# Patient Record
Sex: Male | Born: 1940 | Race: White | Hispanic: No | Marital: Married
Health system: Southern US, Community
[De-identification: ages and names within clinical notes are randomized; demographics above are authoritative.]

---

## 2006-01-08 ENCOUNTER — Ambulatory Visit: Payer: Self-pay | Admitting: Gastroenterology

## 2006-01-24 ENCOUNTER — Other Ambulatory Visit: Payer: Self-pay

## 2006-01-26 ENCOUNTER — Inpatient Hospital Stay: Payer: Self-pay | Admitting: General Surgery

## 2006-06-11 ENCOUNTER — Ambulatory Visit: Payer: Self-pay | Admitting: Gastroenterology

## 2007-04-19 ENCOUNTER — Ambulatory Visit: Payer: Self-pay | Admitting: Family Medicine

## 2007-04-26 ENCOUNTER — Ambulatory Visit: Payer: Self-pay | Admitting: Family Medicine

## 2007-08-12 ENCOUNTER — Ambulatory Visit: Payer: Self-pay | Admitting: Cardiology

## 2007-10-03 ENCOUNTER — Ambulatory Visit: Payer: Self-pay | Admitting: Cardiology

## 2007-10-04 ENCOUNTER — Other Ambulatory Visit: Payer: Self-pay

## 2009-04-12 ENCOUNTER — Ambulatory Visit: Payer: Self-pay | Admitting: Gastroenterology

## 2010-04-05 ENCOUNTER — Ambulatory Visit: Payer: Self-pay | Admitting: Internal Medicine

## 2011-08-08 ENCOUNTER — Ambulatory Visit: Payer: Self-pay | Admitting: Gastroenterology

## 2011-08-08 LAB — PROTIME-INR
INR: 1.2
Prothrombin Time: 15.2 secs — ABNORMAL HIGH (ref 11.5–14.7)

## 2011-08-10 LAB — PATHOLOGY REPORT

## 2012-05-13 ENCOUNTER — Ambulatory Visit: Payer: Self-pay | Admitting: Unknown Physician Specialty

## 2012-10-15 ENCOUNTER — Ambulatory Visit: Payer: Self-pay

## 2013-02-05 ENCOUNTER — Ambulatory Visit: Payer: Self-pay | Admitting: Ophthalmology

## 2013-06-02 ENCOUNTER — Ambulatory Visit: Payer: Self-pay | Admitting: Urology

## 2013-07-03 ENCOUNTER — Ambulatory Visit: Payer: Self-pay | Admitting: Urology

## 2013-07-03 LAB — PROTIME-INR
INR: 1.8
Prothrombin Time: 20.5 secs — ABNORMAL HIGH (ref 11.5–14.7)

## 2013-07-17 ENCOUNTER — Ambulatory Visit: Payer: Self-pay | Admitting: Urology

## 2013-07-17 LAB — PROTIME-INR
INR: 1.2
PROTHROMBIN TIME: 15.1 s — AB (ref 11.5–14.7)

## 2014-01-09 ENCOUNTER — Emergency Department: Payer: Self-pay | Admitting: Emergency Medicine

## 2014-01-09 LAB — COMPREHENSIVE METABOLIC PANEL
AST: 41 U/L — AB (ref 15–37)
Albumin: 3.3 g/dL — ABNORMAL LOW (ref 3.4–5.0)
Alkaline Phosphatase: 79 U/L
Anion Gap: 9 (ref 7–16)
BUN: 33 mg/dL — AB (ref 7–18)
Bilirubin,Total: 2.2 mg/dL — ABNORMAL HIGH (ref 0.2–1.0)
CREATININE: 1.69 mg/dL — AB (ref 0.60–1.30)
Calcium, Total: 8.9 mg/dL (ref 8.5–10.1)
Chloride: 103 mmol/L (ref 98–107)
Co2: 21 mmol/L (ref 21–32)
EGFR (African American): 45 — ABNORMAL LOW
EGFR (Non-African Amer.): 39 — ABNORMAL LOW
GLUCOSE: 128 mg/dL — AB (ref 65–99)
OSMOLALITY: 275 (ref 275–301)
Potassium: 4.6 mmol/L (ref 3.5–5.1)
SGPT (ALT): 28 U/L
Sodium: 133 mmol/L — ABNORMAL LOW (ref 136–145)
TOTAL PROTEIN: 7.2 g/dL (ref 6.4–8.2)

## 2014-01-09 LAB — TROPONIN I: TROPONIN-I: 0.03 ng/mL

## 2014-01-09 LAB — URINALYSIS, COMPLETE
Bilirubin,UR: NEGATIVE
Glucose,UR: 50 mg/dL (ref 0–75)
Ketone: NEGATIVE
Nitrite: NEGATIVE
Ph: 6 (ref 4.5–8.0)
Protein: 100
RBC,UR: 3462 /HPF (ref 0–5)
Specific Gravity: 1.016 (ref 1.003–1.030)
Squamous Epithelial: 4

## 2014-01-09 LAB — CBC
HCT: 39.4 % — ABNORMAL LOW (ref 40.0–52.0)
HGB: 12.7 g/dL — AB (ref 13.0–18.0)
MCH: 28.9 pg (ref 26.0–34.0)
MCHC: 32.2 g/dL (ref 32.0–36.0)
MCV: 90 fL (ref 80–100)
Platelet: 125 10*3/uL — ABNORMAL LOW (ref 150–440)
RBC: 4.39 10*6/uL — ABNORMAL LOW (ref 4.40–5.90)
RDW: 16.4 % — ABNORMAL HIGH (ref 11.5–14.5)
WBC: 7.4 10*3/uL (ref 3.8–10.6)

## 2014-02-23 ENCOUNTER — Ambulatory Visit: Payer: Self-pay | Admitting: Cardiology

## 2014-05-18 ENCOUNTER — Inpatient Hospital Stay: Payer: Self-pay | Admitting: Internal Medicine

## 2014-05-18 LAB — COMPREHENSIVE METABOLIC PANEL
ALBUMIN: 3.3 g/dL — AB (ref 3.4–5.0)
AST: 35 U/L (ref 15–37)
Alkaline Phosphatase: 107 U/L
Anion Gap: 11 (ref 7–16)
BILIRUBIN TOTAL: 1.7 mg/dL — AB (ref 0.2–1.0)
BUN: 23 mg/dL — ABNORMAL HIGH (ref 7–18)
CREATININE: 1.33 mg/dL — AB (ref 0.60–1.30)
Calcium, Total: 8.7 mg/dL (ref 8.5–10.1)
Chloride: 94 mmol/L — ABNORMAL LOW (ref 98–107)
Co2: 24 mmol/L (ref 21–32)
GFR CALC NON AF AMER: 56 — AB
GLUCOSE: 126 mg/dL — AB (ref 65–99)
Osmolality: 264 (ref 275–301)
Potassium: 4.3 mmol/L (ref 3.5–5.1)
SGPT (ALT): 30 U/L
Sodium: 129 mmol/L — ABNORMAL LOW (ref 136–145)
Total Protein: 7.1 g/dL (ref 6.4–8.2)

## 2014-05-18 LAB — CBC
HCT: 30.3 % — ABNORMAL LOW (ref 40.0–52.0)
HGB: 9.8 g/dL — AB (ref 13.0–18.0)
MCH: 29.2 pg (ref 26.0–34.0)
MCHC: 32.5 g/dL (ref 32.0–36.0)
MCV: 90 fL (ref 80–100)
PLATELETS: 179 10*3/uL (ref 150–440)
RBC: 3.37 10*6/uL — ABNORMAL LOW (ref 4.40–5.90)
RDW: 16.8 % — AB (ref 11.5–14.5)
WBC: 8.2 10*3/uL (ref 3.8–10.6)

## 2014-05-18 LAB — URINALYSIS, COMPLETE
BACTERIA: NONE SEEN
Bilirubin,UR: NEGATIVE
GLUCOSE, UR: NEGATIVE mg/dL (ref 0–75)
Ketone: NEGATIVE
Leukocyte Esterase: NEGATIVE
Nitrite: NEGATIVE
PROTEIN: NEGATIVE
Ph: 5 (ref 4.5–8.0)
RBC,UR: 18 /HPF (ref 0–5)
Specific Gravity: 1.014 (ref 1.003–1.030)
WBC UR: 1 /HPF (ref 0–5)

## 2014-05-18 LAB — PROTIME-INR
INR: 2.8
PROTHROMBIN TIME: 28.7 s — AB (ref 11.5–14.7)

## 2014-05-18 LAB — PRO B NATRIURETIC PEPTIDE: B-Type Natriuretic Peptide: 12033 pg/mL — ABNORMAL HIGH (ref 0–450)

## 2014-05-18 LAB — TROPONIN I: Troponin-I: 0.04 ng/mL

## 2014-05-19 LAB — PROTIME-INR
INR: 2.8
Prothrombin Time: 28.7 secs — ABNORMAL HIGH (ref 11.5–14.7)

## 2014-05-19 LAB — BASIC METABOLIC PANEL
Anion Gap: 7 (ref 7–16)
BUN: 24 mg/dL — ABNORMAL HIGH (ref 7–18)
CALCIUM: 8.2 mg/dL — AB (ref 8.5–10.1)
CO2: 28 mmol/L (ref 21–32)
Chloride: 96 mmol/L — ABNORMAL LOW (ref 98–107)
Creatinine: 1.22 mg/dL (ref 0.60–1.30)
EGFR (African American): >60
EGFR (Non-African Amer.): >60
GLUCOSE: 95 mg/dL (ref 65–99)
OSMOLALITY: 267 (ref 275–301)
POTASSIUM: 4 mmol/L (ref 3.5–5.1)
Sodium: 131 mmol/L — ABNORMAL LOW (ref 136–145)

## 2014-05-19 LAB — CBC WITH DIFFERENTIAL/PLATELET
BASOS ABS: 0 10*3/uL (ref 0.0–0.1)
Basophil %: 0.4 %
EOS PCT: 1.3 %
Eosinophil #: 0.1 10*3/uL (ref 0.0–0.7)
HCT: 25.8 % — ABNORMAL LOW (ref 40.0–52.0)
HGB: 8.4 g/dL — ABNORMAL LOW (ref 13.0–18.0)
LYMPHS PCT: 11.7 %
Lymphocyte #: 0.7 10*3/uL — ABNORMAL LOW (ref 1.0–3.6)
MCH: 29 pg (ref 26.0–34.0)
MCHC: 32.6 g/dL (ref 32.0–36.0)
MCV: 89 fL (ref 80–100)
MONOS PCT: 9.3 %
Monocyte #: 0.5 x10 3/mm (ref 0.2–1.0)
NEUTROS PCT: 77.3 %
Neutrophil #: 4.4 10*3/uL (ref 1.4–6.5)
Platelet: 144 10*3/uL — ABNORMAL LOW (ref 150–440)
RBC: 2.91 10*6/uL — AB (ref 4.40–5.90)
RDW: 16.4 % — ABNORMAL HIGH (ref 11.5–14.5)
WBC: 5.6 10*3/uL (ref 3.8–10.6)

## 2014-05-19 LAB — PRO B NATRIURETIC PEPTIDE: B-TYPE NATIURETIC PEPTID: 16191 pg/mL — AB (ref 0–450)

## 2014-05-20 LAB — CBC WITH DIFFERENTIAL/PLATELET
Basophil #: 0 10*3/uL (ref 0.0–0.1)
Basophil %: 0.4 %
EOS PCT: 5.2 %
Eosinophil #: 0.3 10*3/uL (ref 0.0–0.7)
HCT: 25.2 % — ABNORMAL LOW (ref 40.0–52.0)
HGB: 8.3 g/dL — ABNORMAL LOW (ref 13.0–18.0)
LYMPHS PCT: 14.5 %
Lymphocyte #: 0.7 10*3/uL — ABNORMAL LOW (ref 1.0–3.6)
MCH: 29.2 pg (ref 26.0–34.0)
MCHC: 32.8 g/dL (ref 32.0–36.0)
MCV: 89 fL (ref 80–100)
MONO ABS: 0.6 x10 3/mm (ref 0.2–1.0)
Monocyte %: 11.4 %
NEUTROS PCT: 68.5 %
Neutrophil #: 3.4 10*3/uL (ref 1.4–6.5)
PLATELETS: 145 10*3/uL — AB (ref 150–440)
RBC: 2.83 10*6/uL — ABNORMAL LOW (ref 4.40–5.90)
RDW: 16.4 % — ABNORMAL HIGH (ref 11.5–14.5)
WBC: 4.9 10*3/uL (ref 3.8–10.6)

## 2014-05-20 LAB — BASIC METABOLIC PANEL
ANION GAP: 6 — AB (ref 7–16)
BUN: 32 mg/dL — ABNORMAL HIGH (ref 7–18)
CHLORIDE: 95 mmol/L — AB (ref 98–107)
CO2: 29 mmol/L (ref 21–32)
CREATININE: 1.45 mg/dL — AB (ref 0.60–1.30)
Calcium, Total: 8 mg/dL — ABNORMAL LOW (ref 8.5–10.1)
EGFR (African American): >60
GFR CALC NON AF AMER: 50 — AB
Glucose: 89 mg/dL (ref 65–99)
OSMOLALITY: 267 (ref 275–301)
POTASSIUM: 3.8 mmol/L (ref 3.5–5.1)
Sodium: 130 mmol/L — ABNORMAL LOW (ref 136–145)

## 2014-05-20 LAB — PROTIME-INR
INR: 2.5
PROTHROMBIN TIME: 26.7 s — AB (ref 11.5–14.7)

## 2014-05-21 LAB — CBC WITH DIFFERENTIAL/PLATELET
BASOS ABS: 0 10*3/uL (ref 0.0–0.1)
BASOS PCT: 0.8 %
EOS PCT: 3.8 %
Eosinophil #: 0.2 10*3/uL (ref 0.0–0.7)
HCT: 28.3 % — AB (ref 40.0–52.0)
HGB: 9.2 g/dL — ABNORMAL LOW (ref 13.0–18.0)
Lymphocyte #: 0.8 10*3/uL — ABNORMAL LOW (ref 1.0–3.6)
Lymphocyte %: 13.3 %
MCH: 28.9 pg (ref 26.0–34.0)
MCHC: 32.6 g/dL (ref 32.0–36.0)
MCV: 89 fL (ref 80–100)
MONOS PCT: 9.9 %
Monocyte #: 0.6 x10 3/mm (ref 0.2–1.0)
Neutrophil #: 4.1 10*3/uL (ref 1.4–6.5)
Neutrophil %: 72.2 %
Platelet: 199 10*3/uL (ref 150–440)
RBC: 3.19 10*6/uL — ABNORMAL LOW (ref 4.40–5.90)
RDW: 16 % — ABNORMAL HIGH (ref 11.5–14.5)
WBC: 5.7 10*3/uL (ref 3.8–10.6)

## 2014-05-21 LAB — MAGNESIUM: MAGNESIUM: 2 mg/dL

## 2014-05-21 LAB — CREATININE, SERUM
Creatinine: 1.48 mg/dL — ABNORMAL HIGH (ref 0.60–1.30)
EGFR (African American): 60 — ABNORMAL LOW
EGFR (Non-African Amer.): 49 — ABNORMAL LOW

## 2014-05-21 LAB — PROTIME-INR
INR: 2.1
Prothrombin Time: 23.3 secs — ABNORMAL HIGH (ref 11.5–14.7)

## 2014-05-21 LAB — POTASSIUM: Potassium: 4.1 mmol/L (ref 3.5–5.1)

## 2014-05-22 LAB — BASIC METABOLIC PANEL
Anion Gap: 7 (ref 7–16)
BUN: 33 mg/dL — ABNORMAL HIGH (ref 7–18)
CALCIUM: 8.8 mg/dL (ref 8.5–10.1)
CHLORIDE: 96 mmol/L — AB (ref 98–107)
Co2: 29 mmol/L (ref 21–32)
Creatinine: 1.52 mg/dL — ABNORMAL HIGH (ref 0.60–1.30)
EGFR (Non-African Amer.): 48 — ABNORMAL LOW
GFR CALC AF AMER: 58 — AB
Glucose: 101 mg/dL — ABNORMAL HIGH (ref 65–99)
Osmolality: 272 (ref 275–301)
Potassium: 4.2 mmol/L (ref 3.5–5.1)
Sodium: 132 mmol/L — ABNORMAL LOW (ref 136–145)

## 2014-05-22 LAB — PROTIME-INR
INR: 2.2
Prothrombin Time: 23.6 secs — ABNORMAL HIGH (ref 11.5–14.7)

## 2014-05-23 LAB — BASIC METABOLIC PANEL
Anion Gap: 8 (ref 7–16)
BUN: 28 mg/dL — ABNORMAL HIGH (ref 7–18)
CALCIUM: 8.7 mg/dL (ref 8.5–10.1)
CREATININE: 1.54 mg/dL — AB (ref 0.60–1.30)
Chloride: 99 mmol/L (ref 98–107)
Co2: 29 mmol/L (ref 21–32)
GFR CALC AF AMER: 57 — AB
GFR CALC NON AF AMER: 47 — AB
Glucose: 97 mg/dL (ref 65–99)
OSMOLALITY: 277 (ref 275–301)
POTASSIUM: 3.8 mmol/L (ref 3.5–5.1)
SODIUM: 136 mmol/L (ref 136–145)

## 2014-05-23 LAB — PROTIME-INR
INR: 2.3
Prothrombin Time: 24.5 secs — ABNORMAL HIGH (ref 11.5–14.7)

## 2014-05-23 LAB — HEMOGLOBIN: HGB: 9 g/dL — AB (ref 13.0–18.0)

## 2014-05-24 ENCOUNTER — Encounter: Payer: Self-pay | Admitting: Internal Medicine

## 2014-05-28 LAB — PROTIME-INR
INR: 4.8
Prothrombin Time: 43.2 secs — ABNORMAL HIGH (ref 11.5–14.7)

## 2014-05-30 LAB — PROTIME-INR
INR: 4
Prothrombin Time: 38 secs — ABNORMAL HIGH (ref 11.5–14.7)

## 2014-06-01 LAB — PROTIME-INR
INR: 2.8
PROTHROMBIN TIME: 29 s — AB (ref 11.5–14.7)

## 2014-06-02 LAB — PROTIME-INR
INR: 2.4
PROTHROMBIN TIME: 25.2 s — AB (ref 11.5–14.7)

## 2014-06-04 ENCOUNTER — Ambulatory Visit: Payer: Self-pay | Admitting: Gerontology

## 2014-06-09 LAB — PROTIME-INR
INR: 3.6
Prothrombin Time: 35.1 secs — ABNORMAL HIGH (ref 11.5–14.7)

## 2014-06-12 ENCOUNTER — Encounter: Payer: Self-pay | Admitting: Internal Medicine

## 2014-06-13 LAB — PROTIME-INR
INR: 3
Prothrombin Time: 30.6 secs — ABNORMAL HIGH (ref 11.5–14.7)

## 2014-06-16 LAB — PROTIME-INR
INR: 3.5
Prothrombin Time: 34 secs — ABNORMAL HIGH (ref 11.5–14.7)

## 2014-06-19 LAB — BASIC METABOLIC PANEL
Anion Gap: 5 — ABNORMAL LOW (ref 7–16)
BUN: 24 mg/dL — ABNORMAL HIGH (ref 7–18)
CALCIUM: 8.9 mg/dL (ref 8.5–10.1)
CO2: 32 mmol/L (ref 21–32)
Chloride: 101 mmol/L (ref 98–107)
Creatinine: 1.57 mg/dL — ABNORMAL HIGH (ref 0.60–1.30)
EGFR (African American): 56 — ABNORMAL LOW
EGFR (Non-African Amer.): 46 — ABNORMAL LOW
Glucose: 127 mg/dL — ABNORMAL HIGH (ref 65–99)
Osmolality: 281 (ref 275–301)
Potassium: 3.8 mmol/L (ref 3.5–5.1)
Sodium: 138 mmol/L (ref 136–145)

## 2014-10-03 NOTE — Consult Note (Signed)
Brief Consult Note: Diagnosis: Right knee contusion and effusion.   Patient was seen by consultant.   Orders entered.   Comments: 74 year old male fell on right knee 3 days ago with resultant pain and swelling over time.  Admitted to [facility] 2 days ago for congestive heart failure and other medical problems. X-rays negative for fracture or extensive osteoarthritis.    Exam:  Alert and comfortable.  circulation/sensation/motor function good right leg.  range of motion of knee fair.  ace and ice in place.  able to straight leg raise against gravity.  No gap in quadriceps or patellar tendons.  Swelling and ecchymosis present.  X-rays: as above  Imp: contusion and effusion right knee  Rx:  ace, ice, knee immobilizer for ambulation        Physical Therapy weight bearing as tolerated with walker        return to clinic 7-10 days as neeeded.  Electronic Signatures: Valinda HoarMiller, Ly Bacchi E (MD)  (Signed 09-Dec-15 18:23)  Authored: Brief Consult Note   Last Updated: 09-Dec-15 18:23 by Valinda HoarMiller, Timberlyn Pickford E (MD)

## 2014-10-03 NOTE — Discharge Summary (Signed)
PATIENT NAME:  Joe Burgess, Rusell T MR#:  [id number] DATE OF BIRTH:  29-Jul-1940  DATE OF ADMISSION:  05/18/2014 DATE OF DISCHARGE:  05/23/2014  DISPOSITION:  The patient is going to be discharged to [facility].  PRESENTING COMPLAINT: Shortness of breath, right knee swelling.   DISCHARGE DIAGNOSES: 1.  Acute on chronic congestive heart failure, systolic, ejection fraction less than 20%.  2.  Right knee effusion status post fall with trauma, medical management.  3.  Acute on chronic kidney disease stage III.   CODE STATUS: No code, DNR.   DIET: 2 grams sodium.   Home oxygen 3 liters nasal cannula.   FOLLOWUP:    1.  Follow up with cardiology, Dr. Lady GaryFath, in 1 week.  2.  Follow up with Dr. Clance BollLiz White, [facility], in 2 weeks, your  primary care physician.   MEDICATIONS AT DISCHARGE: 1.  Coreg 3.125 b.i.d.  2.  Mag-Ox 400 p.o. daily.  3.  Multivitamin daily.  4.  Sotalol 120 mg b.i.d.  5.  TriCor 145 mg p.o. daily.  6.  Warfarin 2 mg 2 tablets at bedtime on Sunday and 2 mg all other days except Sunday.  7.  Ambien 5 mg at bedtime.  8.  Guaifenesin AC 5-10 mL every 4 hours as needed.  9.  Tessalon Perles 100 mg 3 times a day as needed.  10.  Lasix 20 mg daily.  11.  Senna 1 tablet b.i.d. as needed.  12.  Tylenol 325 mg 2 tablets every 4 hours as needed.   MEDICATIONS HELD: Diovan 160 mg, doxazosin 4 mg. This is due to relative  hypotension and Diovan has been held because of increasing creatinine.   LABORATORY DATA: At discharge: Creatinine is 1.54, baseline is 1.36, BUN is 28. PT/INR is 24.5 and 2.3. H and H is 9.2 and 28.3. White count is 5.7.   Echo Doppler showed EF of less than 20%, severely decreased global left ventricular systolic function, along with increased left ventricular internal cavity size, moderately enlarged right ventricle, moderately dilated left atrium and right atrium. Severe MR and TR.   BRIEF SUMMARY OF HOSPITAL COURSE: Mr. Clare GandyRiddle is a 74 year old  Caucasian gentleman with history of cardiomyopathy, comes in with increasing shortness of breath and had a fall with right knee pain. He was admitted with:  1.  Congestive heart failure, acute on chronic, systolic, exacerbation.  EF of less than 20%. The patient has been noncompliant with nebulizers.  His diuresis has continued.  He stopped taking his Lasix at home, as he was feeling better. We placed him back on his Lasix, his Coreg and his Diovan were held because of marginal increasing creatinine. The patient can resume his Diovan after he sees his primary care physician, and if creatinine continues to improve after discharge. Baseline creatinine is around 1.36.   At discharge was 1.52.  He is making good urine.  2.  Acute on chronic kidney disease, stage II-III, probably from diuretics IV, for which he received on initial admission, changed to p.o. daily dose. Again, his baseline creatinine is around 1.36.  His Diovan has been on hold.  3.  Fall with right knee injury, pain and effusion. No fractures to the knee.  Ortho, Dr. Hyacinth MeekerMiller, was consulted.  Recommend Ace wrap, ice, immobilizer and physical therapy for which the patient is being discharged to [facility].  4.  Hyponatremia due to volume overload with CHF, improving.  5.  Anemia of chronic disease. No acute bleeding.  Volume overload. The patient has CHF and some could be dilutional. His hemoglobin otherwise remained stable.  6.  Relative hypotension.  The patient appears asymptomatic; however, his doxazosin has been held, which could be resumed if his systolic blood pressure remains persistently over 110. Diovan has been held secondary to elevated creatinine, which can be resumed as outpatient if creatinine continues to remain stable.   Overall hospital stay otherwise remained stable.   CODE STATUS: The patient remained a no code, DNR.   He is being discharged to [facility] for rehabilitation.   ____________________________ Wylie Hail Allena Katz,  MD sap:DT D: 05/23/2014 09:27:00 ET T: 05/23/2014 14:02:10 ET JOB#: [id number]  cc: Courtney Paris. Cliffton Asters, FNP Darlin Priestly Lady Gary, MD Valinda Hoar, MD Martine Trageser A. Allena Katz, MD, <Dictator>    Willow Ora MD ELECTRONICALLY SIGNED 06/05/2014 17:01

## 2014-10-03 NOTE — Consult Note (Signed)
74 year old male with fall onto right knee three days ago.  X-rays do not show any acute damage.  Ordered ice, knee immobilizer, and ace bandages.  Physical Therapy to ambulate. Will see patient later.  Electronic Signatures: Valinda HoarMiller, Hugh Kamara E (MD)  (Signed on 09-Dec-15 12:54)  Authored  Last Updated: 09-Dec-15 12:54 by Valinda HoarMiller, Paislynn Hegstrom E (MD)

## 2014-10-03 NOTE — H&P (Signed)
PATIENT NAME:  Joe Burgess, STROEBEL MR#:  [id number] DATE OF BIRTH:  [birth year]  DATE OF ADMISSION:  05/18/2014  REFERRING EMERGENCY ROOM PHYSICIAN: Dr. Derrill Kay.   PRIMARY CARE PHYSICIAN: Dr. Cliffton Asters with [facility].   PRIMARY CARDIOLOGIST: Dr. Lady Gary.   HISTORY OF PRESENT ILLNESS: This very pleasant 74 year old man with past medical history of systolic congestive heart failure, ejection fraction of approximately 15%, coronary artery disease, diabetes mellitus, presents today with right knee pain after a fall and is noted to be short of breath. He reports that he was walking in his house yesterday when he tripped and landed on the right knee. This morning when he awoke the right knee is very swollen and bruised, he is unable to bear weight. In the Emergency Room he is noted to be short of breath and hypoxic with oxygen levels in the high 80s on room air. He reports that for the past 4-5 weeks he has been short of breath, he has had a cough with white sputum. He denies any fevers, chills, nausea, or vomiting. He has not seen any hemoptysis or green sputum. He does not check daily weights. He recently stopped taking Lasix. Hospitalist services are asked to admit for further evaluation and treatment.   PAST MEDICAL HISTORY:  1.  Systolic congestive heart failure with ejection fraction of 15% by cardiac catheterization in September of 2015.  2.  Coronary artery disease.  3.  Diabetes mellitus type 2.  4.  Macular degeneration.  5.  Nephrolithiasis.  6.  History of colon cancer.   PAST SURGICAL HISTORY:  1.  Status post AICD placement.  2.  Kidney surgery September 2015.  3.  Prostate surgery.  4.  Hemicolectomy.  5.  Appendectomy.   SOCIAL HISTORY: The patient lives at home with his wife and sister-in-law. He uses a walker for mobility. He does not use oxygen at home. He quit smoking in 1968. He does not drink alcohol or use any illicit substances.   FAMILY HISTORY:  Positive for coronary artery  disease and diabetes in his father.   ALLERGIES: No known allergies.   HOME MEDICATIONS:  1. Zolpidem 5 mg 1 tablet once a day at bedtime.  2. Warfarin 2 mg 2 tablets once a day at bedtime on Sundays and 1 tablet all other days.   3. TriCor 145 mg 1 tablet once a day in the morning.  4. Sotalol 120 mg 1 tablet 2 times a day.  5. Multivitamin 1 tablet once a day in the morning.  6. Magnesium oxide 400 mg 1 tablet once a day.  7. Guaifenesin AC liquid 5-10 mL every 4 hours as needed for cough.   8. Doxazosin 4 mg 1 tablet once a day.  9. Diovan 160 mg 1 tablet once a day.  10. Carvedilol 3.125 mg 1 tablet twice a day.  11. Amoxicillin-clavulanate 1 tablet every 12 hours.   REVIEW OF SYSTEMS:  CONSTITUTIONAL: Negative as above for fevers, chills, or pain. Positive for fatigue. No change in weight noted.  HEENT: No change in hearing or vision, no pain in the eyes or ears, no sinus pressure, no sore throat or difficulty swallowing.  RESPIRATORY: Positive for coughing, wheezing, shortness of breath with exertion, no painful respirations, no hemoptysis, no history of COPD or tuberculosis.  CARDIOVASCULAR: No chest pain, orthopnea, positive for edema, negative for palpitations or syncope.  GASTROINTESTINAL: No nausea, vomiting, diarrhea, or abdominal pain.  GENITOURINARY: No dysuria or frequency.  MUSCULOSKELETAL: No  new pain in the neck, back, shoulders, knees, or hips, no history of gallops.  NEUROLOGIC: No focal numbness, weakness, confusion, headache, or seizure.  PSYCHIATRIC: No uncontrolled anxiety or depression.   PHYSICAL EXAMINATION:  VITAL SIGNS: Temperature 97.6, pulse 65, respirations 20, blood pressure 134/97, oxygenation 95% on 3 liters.  GENERAL: No acute distress, resting comfortably in the exam bed.  HEENT: Pupils equal, round, and reactive to light, conjunctivae clear, no icterus or injection, extraocular motion is intact, oral mucous membranes are dry, he has dentures,  posterior oropharynx is clear with no exudate, erythema, or edema, no cervical lymphadenopathy, trachea is midline, thyroid is nontender.  PULMONARY: He has diffuse rhonchi, coarse breath sounds, coughing productive of thick sputum, scattered wheezes, fair air movement.  HEART:  Regular rate and rhythm, no murmurs, rubs, or gallops, 2 + pitting edema bilaterally, 1 + peripheral pulses.  ABDOMEN: Obese, soft, nontender, no guarding, no rebound, decreased bowel sounds.  MUSCULOSKELETAL: The right knee has a large effusion, bruising, decreased range of motion due to pain, no other joint effusions or joint deformities, strength 5 out of 5 throughout with the exception of the right leg which he is reluctant to move.  NEUROLOGIC: Cranial nerves II through XII grossly intact, strength and sensation intact bilaterally, nonfocal.  PSYCHIATRIC: The patient is alert and oriented x 4 with good insight into his clinical condition, no signs of uncontrolled depression or anxiety.   LABORATORY DATA:  Sodium 129, potassium 4.3, chloride 94, bicarbonate 24, BUN 23, creatinine of 1.33, glucose of 126. BNP 12,033. LFTs normal with the exception of a low albumin at 3.3. Troponin 0.04. White blood cells 8.2, hemoglobin 9.8, platelets 197,000, MCV is 90. UA positive for 18 red blood cells, 1 white blood cell.    IMAGING:  1.  Right knee complete, there is diffuse soft tissue swelling with marked peripatellar edema. Osteoarthritis. No fracture or subluxation. 2.  Chest x-ray shows cardiac enlargement with vascular congestion. Mild bibasilar atelectasis.   ASSESSMENT AND PLAN:  1.  Congestive heart failure exacerbation, systolic, acute on chronic: The patient is being admitted due to CHF exacerbation with shortness of breath and hypoxia. We will start diuresis. He stopped taking Lasix at home as he felt his condition had improved. He does not check daily weights, so he does not know if he has gained weight recently, but he  does have 2 + pitting edema bilaterally. We will put on a low sodium diet. Check Is and Os and daily weights. 2.  Fall with knee injury: There is no fracture to the knee. We will get a physical therapy evaluation due to fall. He reports no loss of consciousness, no head injury, no confusion after the fall. Likely a mechanical fall.  3.  Hyponatremia: This is likely due to volume overload and should improve with Lasix. Recheck in the morning.  4.  Chronic anemia, he has had a significant drop in his hemoglobin from 12.7 to 9.8 since June of this year. No bleeding noted. He is volume overloaded and some of this may be dilutional. We will continue with diuresis and recheck in the morning.  5.  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  6.  Code status: The patient is a DNR.  I discussed this with the patient today and he does not want resuscitation. He is alert and oriented with good insight into his clinical condition. This order has been  placed in the chart.  7.  Prophylaxis: The patient will be on heparin for DVT prophylaxis. No GI prophylaxis.   TIME SPENT ON ADMISSION: 50 minutes.   ____________________________ Ena Dawleyatherine P. Clent RidgesWalsh, MD cpw:bu D: 05/18/2014 17:00:00 ET T: 05/18/2014 17:57:03 ET JOB#: [id number]  cc: Santina Evansatherine P. Clent RidgesWalsh, MD, <Dictator> Gale JourneyATHERINE P WALSH MD ELECTRONICALLY SIGNED 05/18/2014 21:30

## 2015-04-13 DEATH — deceased

## 2016-05-25 IMAGING — CR DG CHEST 1V PORT
1 series · 2 of 2 positions shown · non-contrast
Comparison: 01/09/2014

CLINICAL DATA: Fall yesterday.

EXAM:
PORTABLE CHEST - 1 VIEW

[Series 1: ap · 0.17mm/px · 2 of 2 slices shown]
[im 1/2]
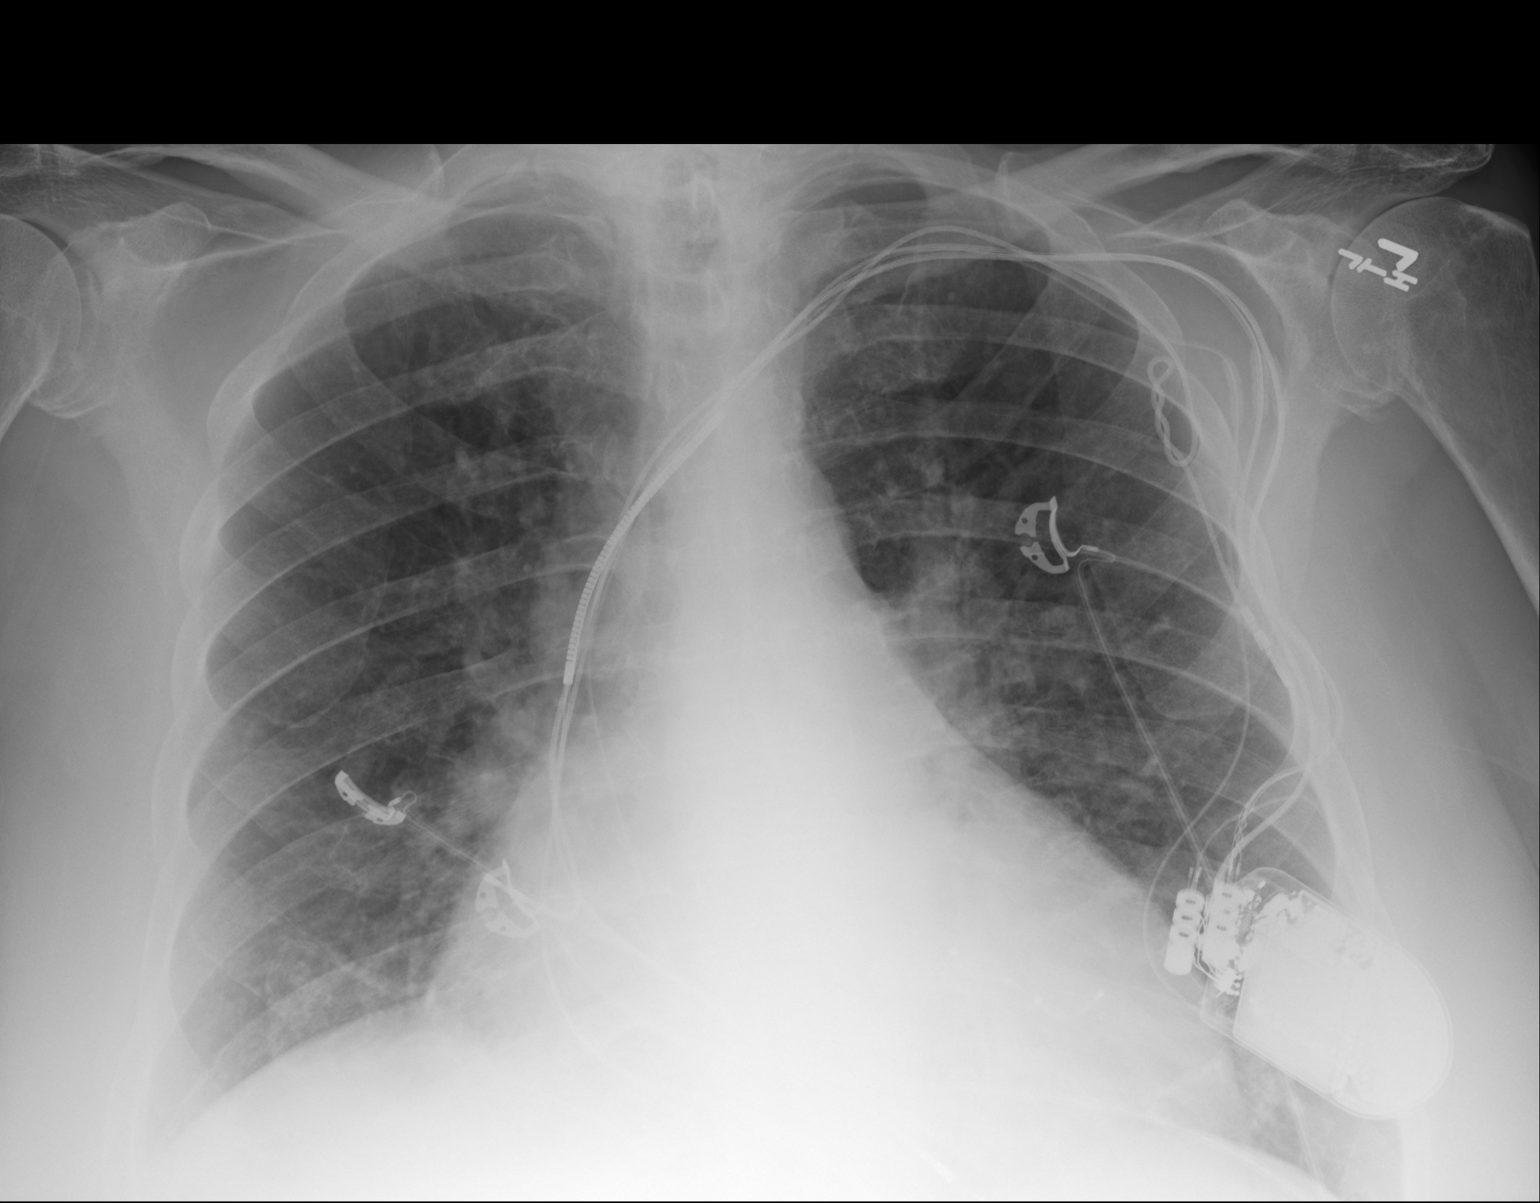
[im 2/2]
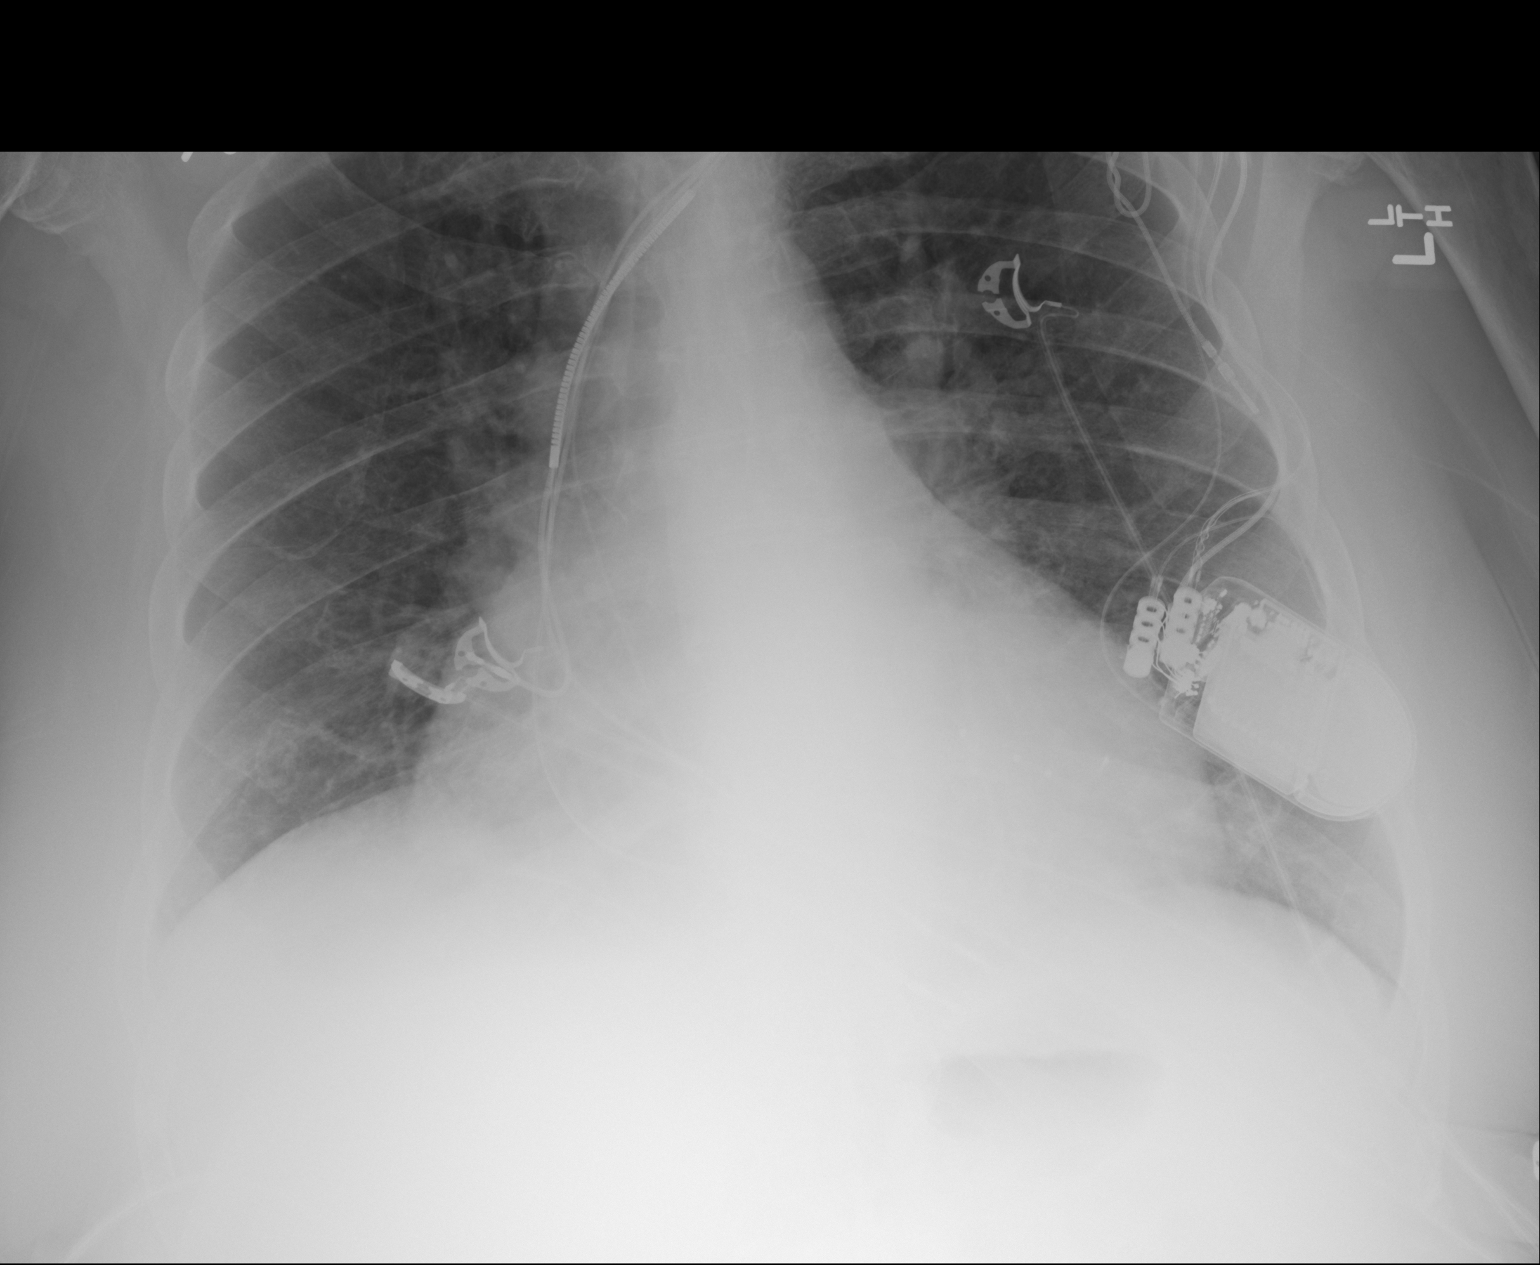

[2 of 2 positions shown; findings below may reference images not displayed]

FINDINGS: Cardiac enlargement with AICD which has been placed since the prior
study. One of the old wires is twisted in the left axillary region,
unchanged.

Mild vascular congestion without edema or effusion. Mild bibasilar
atelectasis.
IMPRESSION: Cardiac enlargement with vascular congestion. Mild bibasilar
atelectasis.

## 2016-06-11 IMAGING — CT CT OF THE RIGHT KNEE WITHOUT CONTRAST
3 of 5 series · 14 of 33 positions shown, 17 images · non-contrast
Comparison: None.

CLINICAL DATA: Status post fall, pain and bruising. Date of injury
05/18/2014

EXAM:
CT OF THE RIGHT KNEE WITHOUT CONTRAST
TECHNIQUE: Multidetector CT imaging of the RIGHT knee was performed according
to the standard protocol. Multiplanar CT image reconstructions were
also generated.

[Series 2: knee · axial · 0.49mm/px · z∈[+431,+628]mm · 6 of 277 slices shown, 8 images]
[im 40/277  soft-tissue]
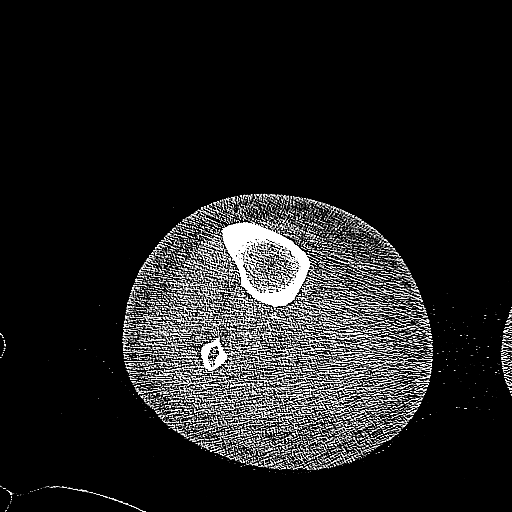
[im 40/277  bone]
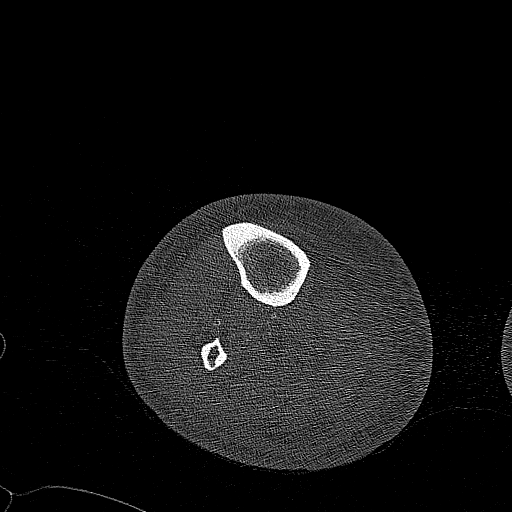
[im 79/277  bone]
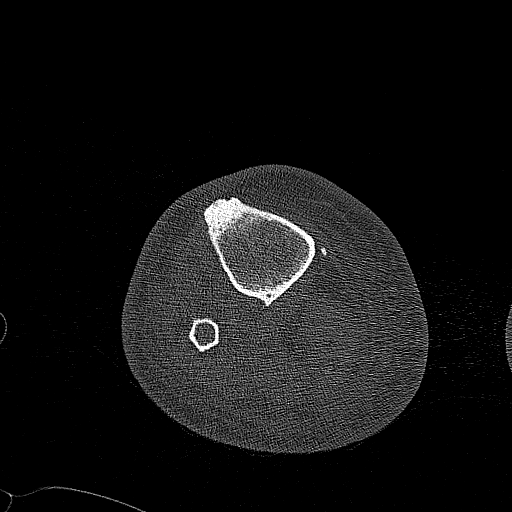
[im 119/277  bone]
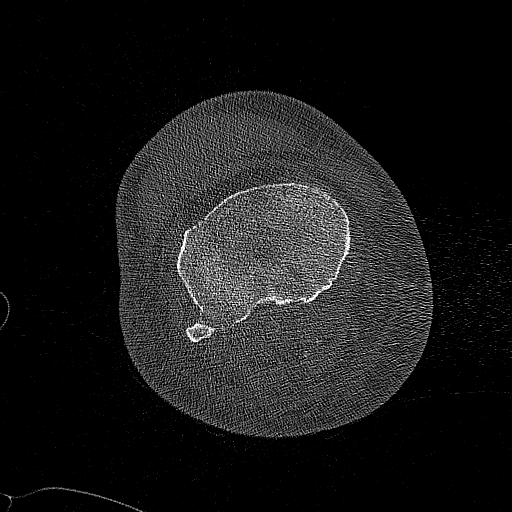
[im 158/277  bone]
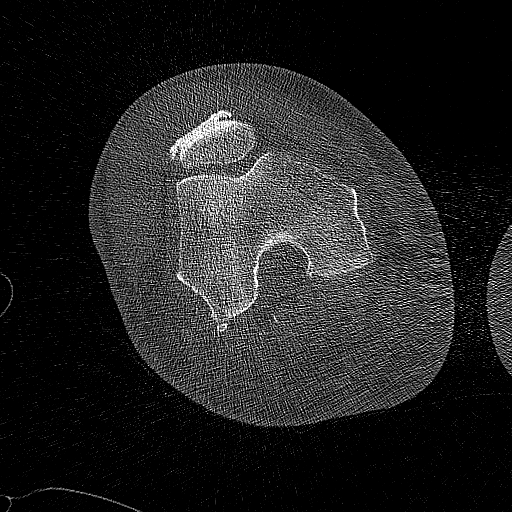
[im 198/277  soft-tissue]
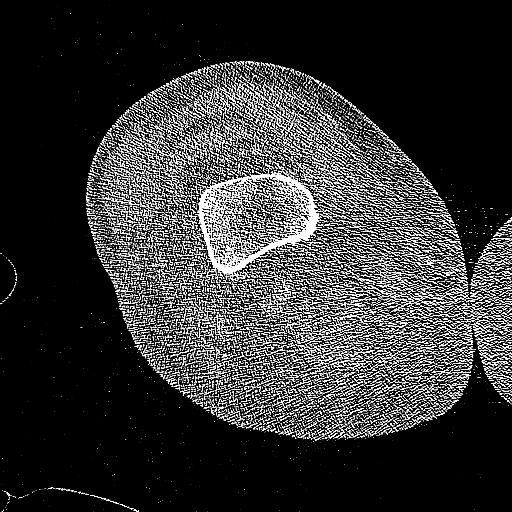
[im 198/277  bone]
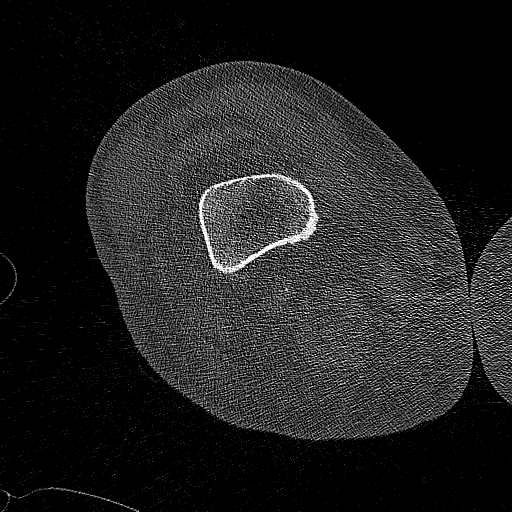
[im 237/277  bone]
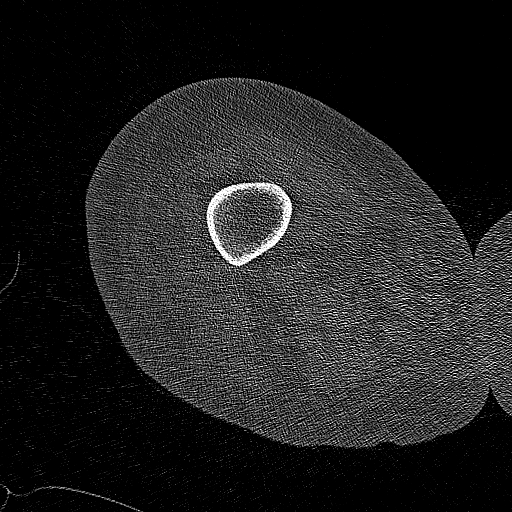

[Series 605: sagittal soft tissue · sagittal · 0.54mm/px · 5 of 63 slices shown, 6 images]
[im 21/63  bone]
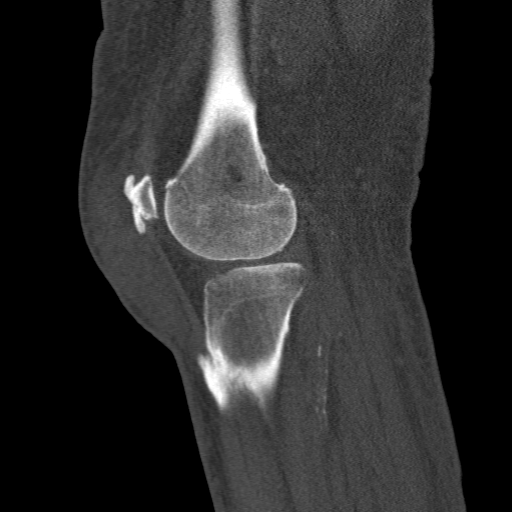
[im 26/63  bone]
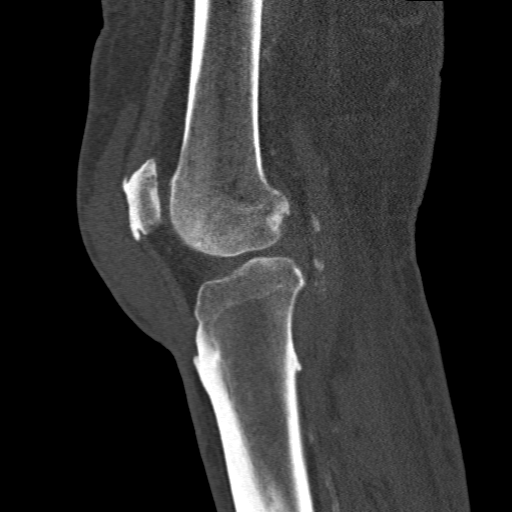
[im 32/63  soft-tissue]
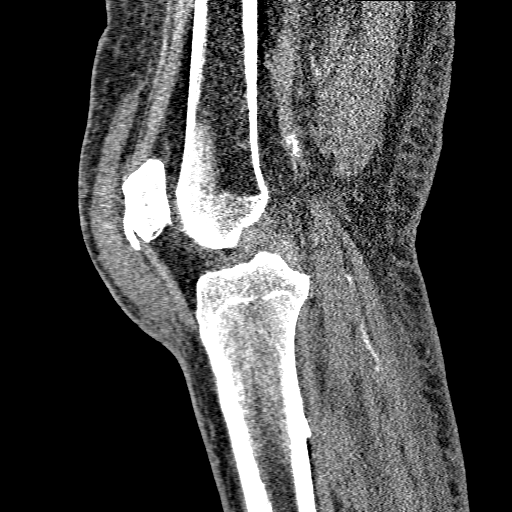
[im 32/63  bone]
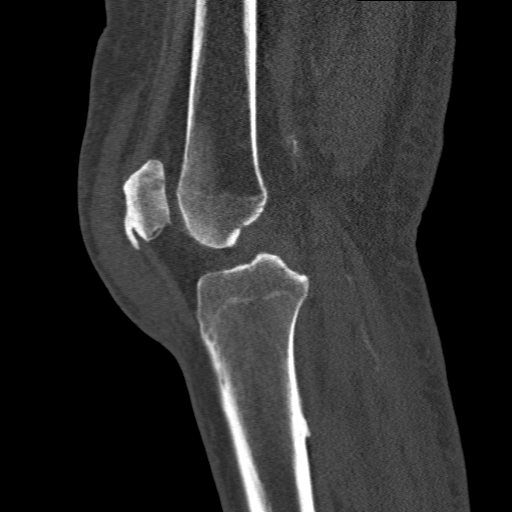
[im 37/63  bone]
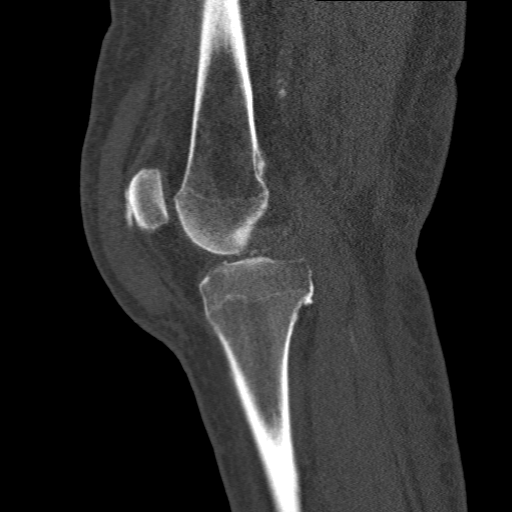
[im 42/63  bone]
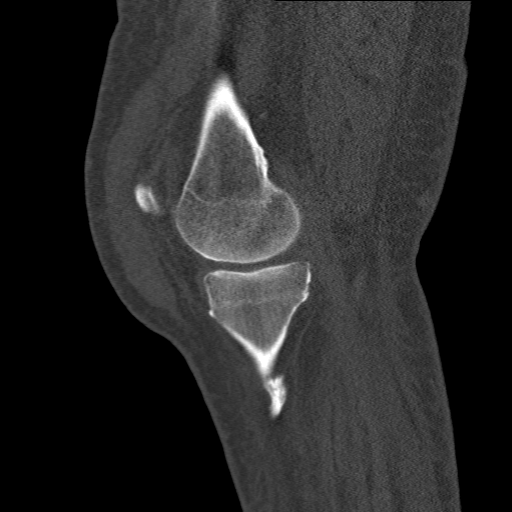

[Series 606: coronal soft tissue · coronal · 0.54mm/px · 3 of 62 slices shown]
[im 13/62  bone]
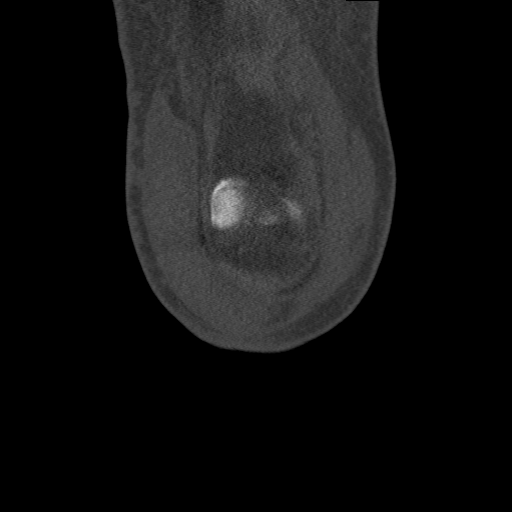
[im 25/62  bone]
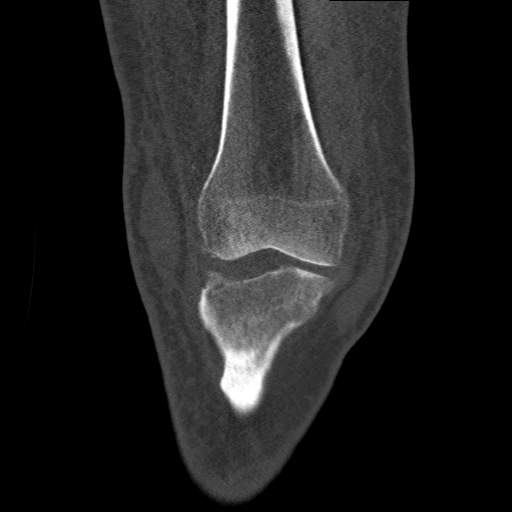
[im 37/62  bone]
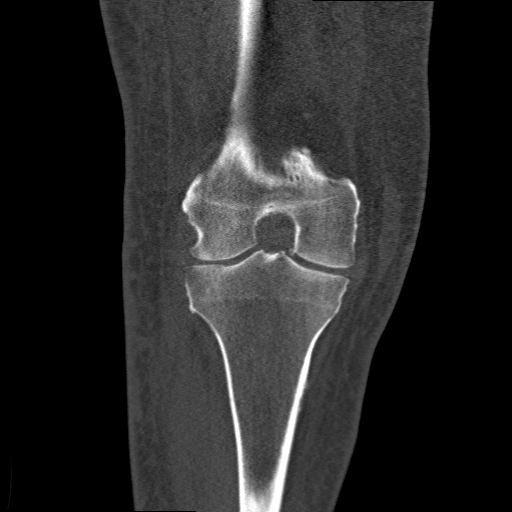

[14 of 33 positions shown; findings below may reference images not displayed]

FINDINGS: No fracture or dislocation. No lytic or sclerotic osseous lesion.
Osseous protuberance along the lateral proximal tibial metaphysis.
No significant joint effusion. Mild tricompartmental osteoarthritis
of the right knee.

There is peripheral vascular atherosclerotic disease. There is a
large amount of high attenuation material within the prepatellar
bursa.

There is peripheral vascular atherosclerotic disease. The extensor
mechanism is intact. Generalized soft tissue edema around the knee.
IMPRESSION: 1. No acute osseous injury of the right knee.
2. Large prepatellar hemorrhagic bursitis.
